# Patient Record
Sex: Female | Born: 1960 | Race: White | Hispanic: No | Marital: Single
Health system: Midwestern US, Academic
[De-identification: ages and names within clinical notes are randomized; demographics above are authoritative.]

---

## 2017-04-05 ENCOUNTER — Encounter: Admit: 2017-04-05 | Discharge: 2017-04-05 | Payer: BC Managed Care – HMO

## 2017-04-05 DIAGNOSIS — K219 Gastro-esophageal reflux disease without esophagitis: ICD-10-CM

## 2017-04-05 DIAGNOSIS — M21372 Foot drop, left foot: Secondary | ICD-10-CM

## 2017-04-05 DIAGNOSIS — G629 Polyneuropathy, unspecified: Secondary | ICD-10-CM

## 2017-04-05 DIAGNOSIS — C541 Malignant neoplasm of endometrium: Principal | ICD-10-CM

## 2017-04-05 DIAGNOSIS — Z79899 Other long term (current) drug therapy: ICD-10-CM

## 2017-04-05 DIAGNOSIS — M549 Dorsalgia, unspecified: Principal | ICD-10-CM

## 2017-04-05 DIAGNOSIS — R300 Dysuria: ICD-10-CM

## 2017-04-05 DIAGNOSIS — Z87891 Personal history of nicotine dependence: ICD-10-CM

## 2017-04-05 LAB — URINALYSIS DIPSTICK
Lab: 1 (ref 1.003–1.035)
Lab: NEGATIVE
Lab: NEGATIVE
Lab: 7.5 (ref 5.0–8.0)
Lab: NEGATIVE

## 2017-04-05 MED ORDER — GABAPENTIN 300 MG PO CAP
300 mg | ORAL_CAPSULE | Freq: Two times a day (BID) | ORAL | 3 refills | Status: AC
Start: 2017-04-05 — End: 2017-06-07

## 2017-04-05 NOTE — Progress Notes
Subjective     GYNECOLOGIC ONCOLOGY EVALUATION    Name:Lisa Shelton    Date: 04/06/17    Referring Physician:     Primary Care Physician: Orson Gear    Chief Complaint:   Chief Complaint   Patient presents with   ??? Heme/Onc Care       History of Present Illness:  Lisa Shelton is a 56 y.o. 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  Presents for 5 month cancer surveillance visit and EOT.       Onc Timeline    Lisa Shelton is a 56 y.o. 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      ??? Referring Physician:  Orson Gear  ??? Contact Name & Number:  [phone] Carmon Ginsberg [phone]  ??? PCP: [name]     Presents for EOT and 5 month cancer surveillance visit. States she continues to have L inguinal/upper thigh and outer thigh pain.  Reports L foot drop and has troubles with weight bearing on L leg.  These symptoms have been present since surgery 11/03/16 and in some situations have worsened.  Did PT for short while following surgery with relief, however symptoms have returned and slightly worsened since discontinuing.  Also reports pelvic cramping on occasion as if she is going to start my period.  Has noticed what appears to be fluid collection at the mons.  She also continues to report fatigue.      Appetite good and hydrating well.  Denies chest pain, SOA, cough, abdominal bloating, hematuria, hematochezia, vaginal bleeding/discharge, constipation/diarrhea, dysuria, bone pain, LE edema or fever.           Endometrial cancer (HCC)    10/11/2016 Initial Diagnosis     [facility] 07/27/16 with report of PMB.  TVUS with thickened endometrium 0.9 cm along with echoic structure along the ventral endometrium measuring 0.5 x 0.6 cm.  EMB with endometrioid carcinoma, FIGO grade 2, nuclear grade 3.          10/16/2016 Imaging     CT C/A/P without lymphadenopathy.  Identified enlarged uterus with prominent endometrial stripe.  Lungs with noted granulomas or scarring only.         11/03/2016 Surgery     RATLH/BSO with bilateral P and PALND per Dr. Kem Boroughs.  Serosal defect to the duodenum with repair by Dr. Kathee Delton.         11/03/2016 Pathology     Uterus with endometrioid adenocarcinoma, FIGO grade 2.  DOI 5/25 mm.  No cervical involvement.  0/41 LN involved.  Tumor size 2.9 cm.  Pelvic washing (-) for malignant cells.    MLH1 (99%); MSH 2 (98%); MSH6 (99%); PMS2 (99%); ER (40%); PR (50%); Ki67 63%; p53 40%.  NO EVIDENCE OF MICROSATELLITE INSTABILITY.                Past Medical History:  Past Medical History:   Diagnosis Date   ??? Back pain    ??? GERD (gastroesophageal reflux disease)        Past Surgical History:  Past Surgical History:   Procedure Laterality Date   ??? HX TUBAL LIGATION  1986   ??? HX CHOLECYSTECTOMY  2014    laprascopic   ??? HYSTERECTOMY, TOTAL ABDOMINAL Bilateral 11/03/2016    ROBOTIC-ASSISTED HYSTERECTOMY, BILATERAL SALPINGO-OOPHORECTOMY, BILATERAL PELVIC AND PERI-AORTIC LYMPH NODE DISSECTION  performed by Irine Seal, MD at [facility]   ???  ENTEROCELE REPAIR  11/03/2016    REPAIR OF ENTEROTOMY performed by Ashcraft, Humberto Leep, DO at [facility]   ??? COLONOSCOPY         Medications:    Current Outpatient Prescriptions:   ???  acetaminophen (TYLENOL) 500 mg tablet, Take 1,000 mg by mouth three times daily. Max of 4,000 mg of acetaminophen in 24 hours. , Disp: , Rfl:   ???  cholecalciferol (VITAMIN D-3) 1,000 units tablet, Take 2,000 Units by mouth daily., Disp: , Rfl:   ???  gabapentin (NEURONTIN) 300 mg capsule, Take one capsule by mouth twice daily., Disp: 60 capsule, Rfl: 3  ???  MV-Min-Vit C-Glut-Lysine-Hb124 (AIRBORNE (LYSINE HCL)) 1,000-50 mg tbef, Take 1 tablet by mouth twice daily., Disp: , Rfl:   ???  omeprazole DR(+) (PRILOSEC) 40 mg capsule, Take 40 mg by mouth daily before breakfast., Disp: , Rfl: 2    Allergies:  No Known Allergies    Social History:  Social History     Social History ??? Marital status: Single     Spouse name: N/A   ??? Number of children: N/A   ??? Years of education: N/A     Occupational History   ??? Not on file.     Social History Main Topics   ??? Smoking status: Former Smoker     Packs/day: 0.25     Years: 12.00     Types: Cigarettes     Quit date: 08/25/2016   ??? Smokeless tobacco: Never Used   ??? Alcohol use 0.6 oz/week     1 Glasses of wine per week      Comment: rarely   ??? Drug use: No   ??? Sexual activity: Not on file     Other Topics Concern   ??? Not on file     Social History Narrative   ??? No narrative on file       Family History:  Family History   Problem Relation Age of Onset   ??? Cancer-Breast Mother    ??? Diabetes Mother    ??? Heart Disease Mother    ??? Hypertension Mother    ??? High Cholesterol Mother    ??? Thyroid Disease Mother    ??? Diabetes Brother    ??? Heart Disease Brother    ??? Hypertension Brother    ??? Thyroid Disease Brother      REVIEW OF SYSTEMS:             CONSTITUTIONAL: per HPI  EYES: per HPI  ENT: per HPI  RESPIRATORY: per HPI  CARDIOVASCULAR: per HPI  GI: per HPI  GU: per HPI  MUSCULO-SKELETAL: per HPI  SKIN: per HPI  ENDOCRINE: per HPI  HEMATOLOGIC: per HPI    Physical Exam:  BP 124/75 (BP Source: Arm, Left Upper)  - Pulse 58  - Temp 36.7 ???C (98 ???F) (Oral)  - Ht 160 cm (63)  - Wt 59.6 kg (131 lb 6.4 oz)  - SpO2 99%  - BMI 23.28 kg/m???   GENERAL APPEARANCE: Appears healthy.  Alert; in no acute distress.  Pleasant.  HEENT: Unremarkable. No tenderness or masses noted.  NECK: Neck supple. No tenderness. No adenopathy.    LUNGS: Chest symmetrical. Good diaphragmatic excursion. Lungs clear; normal breath sounds.  CARDIOVASCULAR:  RRR. Heart sounds normal.      ABDOMEN: Abdomen soft, non-tender.  No masses, organomegaly, or hernia. Minimal fluid collection of mons which appears to be lymphedema.  PELVIC:    Vaginal cuff and  vagina demonstrates atrophy without lesions/masses/nodularity/discharge.  Bimanual and vaginal exams without masses, nodularity or thickening.  Exam chaperoned by nurse  EXTREMITIES: Extremities normal. No joint deformities, edema, or skin discoloration. Station and gait normal.  SKIN: Skin color, texture, turgor normal. No rashes or lesions.  LYMPH NODES: No palpable supraclavicular or inguinal lymph nodes.    ASSESSMENT/PLAN:  ??? BESS VANWAGENEN is a 56 y.o. 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    ??? Presents for 5 month cancer surveillance visit and EOT.   ??? Amb referral to neurology.  Gabapentin 300 mg at HS x 7 days then BID.  Provided with contact information for lymphedema clinic [facility] or [facility].  Encourage pelvic floor therapy as well.  ??? Urine C/S and treat accordingly.  Consider urology consult d/t h/o frequent UTIs.  Consider interstitial cystitis?  ??? Discussed possible symptoms of cancer recurrence, such as cough, chest pain, early satiety, abdominal pain/bloating, N/V, vaginal bleeding/discharge or change in bowel/bladder habits, dizziness, HA.    ??? Recommend daily exercise, diet high in fruits and vegetables, annual visits with PCP including updated vaccination and routine screening (mammogram, DEXA and colonoscopy).    ??? RV 3 months with Dr. Kem Boroughs.     **Time spent with patient 60 min discussing symptoms, reviewing pathology and end of treatment summary.  Counseled on potential symptoms of cancer recurrence and outlined surveillance visit schedule.        Treatment Summary for Endometrial cancer [facility])     Charlton Haws, APRN  04/01/2017  1:22 PM      Cancer Treatment Summary  Provided by Siri Cole, APRN on 04/01/2017       General Information   Patient Name AMYRE BULLMAN   Patient ID [id number]   Phone [phone] (home)    Date of birth 09/23/1960   Age 19 y.o.   Support Contact Extended Emergency Contact Information  Primary Emergency Contact: Butteron,James  Address: [address]           [place] [address] [address]  Home Phone: [phone] Mobile Phone: [phone]  Relation: Significant Other  Secondary Emergency Contact: Deri [place]  Home Phone: [phone]  Mobile Phone: [phone]  Relation: Daughter         Care Team   Patient Care Team:  Orson Gear, MD as PCP - General (Internal Medicine)      Cancer Diagnosis Information   Symptoms/Signs Postmenopausal bleeding with thickened endometrium.  Endometrial biopsy with endometrioid carcinoma.     Diagnosis Endometrial cancer [facility])   Diagnosis Date 11/03/16   Staging Information Cancer Staging  No matching staging information was found for the patient.   Tumor & Prognostic Markers MLH1 (99%); MSH 2 (98%); MSH6 (99%); PMS2 (99%); ER (40%); PR (50%); Ki67 63%; p53 40%.  NO EVIDENCE OF MICROSATELLITE INSTABILITY.    No results found for: BR153, CA2729, HER2NEU   Genomic Testing none   Surgical Procedure: Location/Findings Past Surgical History:   Procedure Laterality Date   ??? HX TUBAL LIGATION  1986   ??? HX CHOLECYSTECTOMY  2014    laprascopic   ??? HYSTERECTOMY, TOTAL ABDOMINAL Bilateral 11/03/2016    ROBOTIC-ASSISTED HYSTERECTOMY, BILATERAL SALPINGO-OOPHORECTOMY, BILATERAL PELVIC AND PERI-AORTIC LYMPH NODE DISSECTION  performed by Irine Seal, MD [facility]   ??? ENTEROCELE REPAIR  11/03/2016    REPAIR OF ENTEROTOMY performed by Ashcraft, Humberto Leep, DO at [facility]   ??? COLONOSCOPY  Tumor Type/Histology/Grade Stage IA endometrioid adenocarcinoma of the uterus, FIGO grade 2.         Background Information   Family History/predisposing conditions Family History   Problem Relation Age of Onset   ??? Cancer-Breast Mother    ??? Diabetes Mother    ??? Heart Disease Mother    ??? Hypertension Mother    ??? High Cholesterol Mother    ??? Thyroid Disease Mother    ??? Diabetes Brother    ??? Heart Disease Brother    ??? Hypertension Brother    ??? Thyroid Disease Brother       Warden/ranger none   Social History Social History   Substance Use Topics   ??? Smoking status: Former Smoker Packs/day: 0.25     Years: 12.00     Types: Cigarettes     Quit date: 08/25/2016   ??? Smokeless tobacco: Never Used   ??? Alcohol use 0.6 oz/week     1 Glasses of wine per week      Comment: rarely            Treatment Summary   Radiation Therapy:  N/A      [No treatment plan]       Pre-Treatment Post-Treatment   Height  Ht Readings from Last 1 Encounters:   01/04/17 157.5 cm (62)      Weight  Wt Readings from Last 1 Encounters:   01/04/17 58.8 kg (129 lb 9.6 oz)      BSA  Estimated body surface area is 1.6 meters squared as calculated from the following:    Height as of 01/04/17: 157.5 cm (62).    Weight as of 01/04/17: 58.8 kg (129 lb 9.6 oz).      Lifetime Dosage   Lifetime Dose Tracking:   No doses have been documented on this patient for the following tracked chemicals: mitomycin, epirubicin, doxorubicin, idarubicin, bleomycin, daunorubicin, mitoxantrone, vincristine, doxorubicin HCl pegylated liposomal, daunorubicin citrate liposomal            Follow-Up & Survivorship Care   Future Appointments  Date Time Provider Department Center   04/05/2017 2:00 PM Charlton Haws, APRN [facility] [place] Exam        GYNECOLOGIC CANCER SURVIVORSHIP:  Radiation and other cancer therapies such as surgery and chemotherapy can increase the risk that you may develop additional health problems in the months and years after treatment.  During this time, it is important that you continue to work closely with your healthcare providers to maintain your overall health, to monitor for any treatment related side effects which may develop, and to watch for signs of the cancer returning.  The information provided below is meant to provide additional resources and information which you may find helpful in this process.  Please keep in mind, this is general information which may or may not apply to your specific situation; it should not be taken as a substitute for the advice of your healthcare providers.   ???  SELF CARE AND HEALTH MAINTENANCE:    ??? Weight and Nutrition:  Maintain a healthy weight for your age and height.  Weigh yourself 1-2 times per week to track your progress.  Exercise upon recovery and healing when tolerated.  If you are interested in meeting with a hospital dietician, please contact our office to schedule an appointment.  ???  Emotional distress:  Post treatment can be a difficult time. Fear, anxiety, or sadness may actually increase. In addition to the relief of  ending treatment, many people report feeling more insecure without the safety net of active treatment and frequent contact with medical staff. Even with a good prognosis, fear of cancer recurrence is very common for many people. Knowing this is very common can normalize this issue somewhat. Developing a personal health/wellness plan that addresses your emotional, physical, social, and spiritual well-being can be of great importance. We have psychologists that specifically help with cancer patients in this emotional time. Please contact our office if this is of interest. Support groups can also be helpful, see information below on [facility] support group specific to gynecologic cancer patients.  ???  Smoking Cessation:  If you are a current smoker, smoking cessation is key to your overall health and recovery.  If you are interested in smoking cessation, there are a number of treatment plans and resources available to help, including medications and/or behavior therapy (counseling).  Please let us know if we can assist you in this process.  ???  COMMON TREATMENT RELATED SIDE EFFECTS:  ???  Sexual Intimacy Issues:    Vaginal dryness and scarring following your treatment can result in discomfort or pain during intercourse.  Use of a lubricant and vaginal dilator can help with these symptoms.    ???  Menopausal Symptoms:  Surgery and/or radiation can result in early menopausal in women who are premenopausal prior to treatment.  Symptoms may include hot flashes, night sweats, and vaginal dryness.    ???  Leg Swelling:  It is possible that swelling of the legs, a condition called lymphedema, may occur following your treatment.  Should this develop, please contact your physician.  Treatments your physician may recommended include compression hose, therapeutic massage, or physical therapy.  ???  FOLLOW UP of GYNECOLOGIC CANCER:  Once you have been diagnosed with cancer, it is possible for the cancer to return or to spread to other parts of the body.  It is important to continue regular follow up visits with your radiation oncologist and gynecologist to monitor for any signs of cancer recurrence and for assistance with any side effects of treatment.  These visits will typically include a history and physical exam, including a pelvic exam.   Your physicians may also recommend imaging studies such as x-rays or CT scans.  The frequency of follow up visits will depend on the type and stage of your cancer as well as other risk factors and are usually performed at 3-6 month intervals for the first few years after completion of treatment.  ???  WHEN TO CALL  Please call our office if you experience any of the following symptoms after receiving radiation:  ??? Increased vaginal bleeding or discharge  ??? Unexplained weight loss  ??? New leg swelling  ??? Blood in your stool or urine   ??? New lumps or bumps (such as in the groin or neck)  ??? Fevers greater than 100 deg. F  ??? Difficulty with urination or bowel movements  ??? New pelvic or abdominal pain  ???  ADDITIONAL RESOURCES  [place]   [address], [address]   [place], [place] [address]     Third Tuesdays   Social time 6-6:30 p.m.   Meeting 6:30-8 p.m.   Contact [place] [phone] or Pieter Partridge [phone]  ???  Society for Gynecologic Oncology  [email or web address]  ???  Centers for Disease Control and Prevention ResearchName.uy  ???  American Institute for Cancer Research  http://www.[place].info/.html  Siri Cole, APRN

## 2017-04-06 LAB — CULTURE-URINE W/SENSITIVITY

## 2017-06-07 ENCOUNTER — Ambulatory Visit: Admit: 2017-06-07 | Discharge: 2017-06-08 | Payer: BC Managed Care – HMO

## 2017-06-07 ENCOUNTER — Encounter: Admit: 2017-06-07 | Discharge: 2017-06-07 | Payer: BC Managed Care – HMO

## 2017-06-07 DIAGNOSIS — M549 Dorsalgia, unspecified: Principal | ICD-10-CM

## 2017-06-07 DIAGNOSIS — K219 Gastro-esophageal reflux disease without esophagitis: ICD-10-CM

## 2017-06-07 NOTE — Progress Notes
Date of Service: 06/07/2017    Subjective:             Lisa Shelton is a 56 y.o. female.    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    Up until March 2018 the patient has been doing well and had no prior history of neurologic problems whatsoever.  Following a diagnosis of the pelvic lesion she had A RATLH/BSO operation.  Pathology of the lesion showed endometrioid adenocarcinoma of the uterus,FIGO grade 2.  No chemotherapy or radiation therapy was required this follow-up treatment and extensive lymph node dissection (#40) was negative.    Upon awakening from surgery the patient quickly noticed that she had difficulty scooting the left leg laterally when trying to transfer from one stretcher to the hospital bed.  She also quickly realized that she had a fist-sized area of numbness around the left inguinal ligament.  She also noticed that she could not swing the left leg out of the bed and had profound weakness with hip flexion as she describes that she could not pick up the left thigh.  Walking was preserved although the patient could not climb steps with a left lower extremity.    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    Notably the patient since discharge from the hospital has continued to stably but slowly improve.  She did physical therapy for 6 weeks.  She has had no bowel or bladder changes and function.  There has been no severe or progressive pain in the pelvic area and particularly in the left pelvis.    The patient presents today to get additional input on management of the left lower extremity proximal weakness as well as additional input on prognosis.      WORKUP TO DATE CT abdomen and pelvis with contrast 11/04/2016:    Interval hysterectomy with no retroperitoneal hematoma or hemoperitoneum.  A small amount of complex pelvic ascites was seen.  Extensive subcutaneous emphysema throughout the chest, abdomen and pelvis with pneumomediastinum, pneumoperitoneum and right retroperitoneal free air which is likely postoperative.    MRI lumbar spine June 02, 2017:   The patient brought in a disc of the study that was done 1 week ago.  I reviewed the images on the disc and saw evidence of L4-L5 and L5-S1 disc disease without significant spinal stenosis and specifically no findings that correlate well with the patient's current presentation.       Review of Systems   Constitutional: Positive for activity change and fatigue.   Musculoskeletal: Positive for back pain.   Neurological: Positive for numbness.   All other systems reviewed and are negative.        Objective:         ??? acetaminophen (TYLENOL) 500 mg tablet Take 1,000 mg by mouth three times daily. Max of 4,000 mg of acetaminophen in 24 hours.    ??? cholecalciferol (VITAMIN D-3) 1,000 units tablet Take 2,000 Units by mouth daily.   ??? MV-Min-Vit C-Glut-Lysine-Hb124 (AIRBORNE (LYSINE HCL)) 1,000-50 mg tbef Take 1 tablet by mouth twice daily.   ??? Omeprazole (PRILOSEC) 20 mg tablet Take 40 mg by mouth daily before breakfast.     Vitals:  06/07/17 1319   BP: 109/66   Pulse: 75   Weight: 61.2 kg (135 lb)   Height: 160 cm (63)     Body mass index is 23.91 kg/m???.     Physical Exam    GENERAL EXAM:    GEN: Comfortable appearing.  In good spirits  EXTREMITIES: No cyanosis, clubbing or edema   SKIN: No rashes or lesions       NEUROLOGIC EXAMINATION    Mental status: Awake, alert, attentive. Normal language.    Cranial Nerves:   II:   Visual fields are full.   Pupils equal, round and reactive to light  Funduscopic exam:   III, IV, VI: ocular movements intact  CN V: normal face sensation  CN VII: no upper or lower facial weakness Orbicularis oculi: intact  CN VIII: hearing intact to conversation  CN IX, X: palate elevates symmetrically. Not hoarse   CN XI: shoulder shrug intact bilaterally  CN XII: tongue midline, no atrophy    Motor:  Bulk: intact  Tone: normal  Abnormal movements: no tremor, no fasciculations   Clinical myotonia:     Muscles     Neck flexors  5    Neck extensors  5      Upper Extremity Muscle  Right  Left  Lower Extremity Muscle  Right  Left    Shoulder abductors  5  5  Hip flexors  5  4   Elbow flexors  5  5  Hip extensors      Elbow extensors  5  5  Hip abductors  5 5   Wrist flexors    Hip adductors  5 4   Wrist extensors  5  5  Knee flexors         Knee extensors  5  5    Finger abductors 5  5  Ankle plantar flexors      T 5  5    Finger extensors   Ankle dorsiflexors          P 5  4+    Distal finger flex (dig 2,3)   Ankle inversion               T     Distal finger flex (dig 4,5)   Ankle eversion                P     Thumb abduction (APB)   Toe flexors                      T     Thumb flexion (FPL)   EHL(great toe extensor)P  5 4++       Sensory:   Pinprick: decreased in a palm side area just distal to the inguinal ligament.   Temperature: as above  Light touch:   Vibration: (seconds at the great toe)  Right: 12  Left 12  Proprioception (great toes): intact      Coordination: finger to nose testing normal    Reflexes:   Right  Left    Triceps  3+ throughout    Biceps      Brachioradialis      Patella      Ankle      Plantar Responses downgoing  downgoing    Jaw jerk:    Hoffman sign:     Station and Gait:   -walks with ease, narrow based and symmetric.   -difficulty walking on the the left  heal, otherwise no problems with toe waking    Standing up from a chair with the arms crossed over the chest: Is able to do this albeit a bit slowly.       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  Of the hysterectomy performed in March 2018.  The involvement of muscles innervated by the femoral nerve (hip flexion), obturator nerve (thigh abduction) and sciatic nerve (plantar flexion and dorsiflexion albeit mildly) suggest a lumbosacral plexus lesion in the setting over a mononeuropathy.  The patient's consistent albeit gradual improvement in the last 7 months is very reassuring and makes an underlying process like new malignant disease much less likely.  Something like this would also tend to be painful.  Radiation related lumbosacral plexopathy is not a consideration here as the patient was not treated with radiation therapy.    Plan:  ???I asked the patient to call us on short notice if she notices any worsening of her motor deficits.  This would be a very concerning symptom.  ???Otherwise advised on prognosis which is hard to know for sure.  If she is continuing to improve in a week to week basis then it is possible that she may continue to regain strength although I do not know where she might reach a ceiling.  If she has stopped improving currently then this would indicate a more guarded prognosis for continued improvement.  -Otherwise recommend continued supportive care.  Follow-up can be on an as-needed basis.

## 2017-06-08 ENCOUNTER — Encounter: Admit: 2017-06-08 | Discharge: 2017-06-08 | Payer: BC Managed Care – HMO

## 2017-06-08 DIAGNOSIS — M21372 Foot drop, left foot: ICD-10-CM

## 2017-06-08 DIAGNOSIS — S344XXA Injury of lumbosacral plexus, initial encounter: Principal | ICD-10-CM

## 2017-06-09 ENCOUNTER — Encounter: Admit: 2017-06-09 | Discharge: 2017-06-09 | Payer: BC Managed Care – HMO

## 2017-06-28 ENCOUNTER — Encounter: Admit: 2017-06-28 | Discharge: 2017-06-28 | Payer: BC Managed Care – HMO

## 2017-06-28 DIAGNOSIS — M549 Dorsalgia, unspecified: Principal | ICD-10-CM

## 2017-06-28 DIAGNOSIS — C541 Malignant neoplasm of endometrium: Principal | ICD-10-CM

## 2017-06-28 DIAGNOSIS — Z803 Family history of malignant neoplasm of breast: ICD-10-CM

## 2017-06-28 DIAGNOSIS — K219 Gastro-esophageal reflux disease without esophagitis: ICD-10-CM

## 2017-06-28 DIAGNOSIS — Z807 Family history of other malignant neoplasms of lymphoid, hematopoietic and related tissues: ICD-10-CM

## 2017-06-28 DIAGNOSIS — R9389 Abnormal findings on diagnostic imaging of other specified body structures: ICD-10-CM

## 2017-06-28 NOTE — Progress Notes
Name: Lisa Shelton          MRN: [id number]      DOB: 1960-10-17      AGE: 56 y.o.   DATE OF SERVICE: 06/28/2017    Subjective:             Reason for Visit: Routine Surveillance.    Heme/Onc Care      Lisa Shelton is a 56 y.o. female.     Cancer Staging  No matching staging information was found for the patient.    History of Present Illness    Patient Active Problem List    Diagnosis Date Noted   ??? 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    OZH:[id number] [name], [name], MD    HPI:    Ms Lisa Shelton is a  56 yo G2 P2  who had a well woman exam and Pap smear on December 11th 2017.  At that time she reported postmenopausal bleeding and cramping since she began hormone replacement therapy in February 2017.  Plan was to follow-up in February.    On September 16, 2016 she had an ultrasound that showed the uterus to be anteverted measuring 9.2 x 3.8 x 5 cm with a 9 mm endometrial thickening as well as small heterogeneously echogenic structure along the ventral endometrium measuring 5 x 6  millimeters.  There is focal blood flow along this lesion.    On September 29, 2016 showed a follow-up with her primary care physician for an endometrial biopsy which showed an endometrioid adenocarcinoma favor endometrioid FIGO grade 2.  There were however focal areas with higher grade cytology nuclear grade 3 which raises the possibility of a high-grade tumor.  Immunostains attempted for clarification but demonstrated scattered and variable positivity for P 16, p53 and Ki-67.  While endometrioid adenocarcinoma is favored other high-grade subtype such as high-grade serous cannot be completely excluded by biopsy alone.    She is now being referred to my office.  Her last office note with Dr. [name] on 09/29/2016.  The patient is wondering about postmenopausal bleeding and getting back on her polyps.  She did an endometrial biopsy polyp was visualized, cervical versus endometrial.  This was noted on ultrasound.  Recommend a referral to Dr. Broadus John for hysteroscopy [facility] and ablation pending results.  The uterus sounded to 9 cm.    She was seen by Dr. [name] on 07/27/2016 for well woman with Pap.  At that time she was noted to have some vaginal bleeding since starting hormone replacement therapy, she is following up in February with her gynecologist/HRT physician. To call if persistent bleeding for pelvic US. Also appears to have added progesterone  200 mg/day      TREATMENT:    1.  11/03/2016:  Robotic assisted total laparoscopic hysterectomy, bilateral salpingo-oophorectomy, bilateral pelvic and periaortic lymph node dissection.    Final Pathology:  Endometrial adenocarcinoma endometrioid type, FIGO grade???2,  invading 5mm out of 25mm myometrium.  Maximal tumor size: 2.9 cm. All P and PALND negative. Final pathology did not confirm p53 positivity.    2.  11/12/2016: Presented at Cancer Conference. No recommendations for treatment. Observation only.    3. 11/19/2016: Presents for first postoperative visit. The patient is aware of the pathology and recommendations.    4.  06/28/2017:  Last seen on 04/05/2017 for  EOT visit.  She is now 7.5 month from surgery.  Understands screening for endometrial cancer including: q 6 month visits, pelvic evaluation, no pap smears and no CT Scans unless symptomatic.          OLDER RECORDS:    Pathology:  1.  MAWD: Specimen number [id number] 40.  Endometrial biopsy: Endometrial adenocarcinoma favor endometrioid FIGO grade 2.  There are focal areas of high-grade cytology/nuclear grade 3 which raises the possibility of a high-grade tumor.  Immunostains were done but showed scattered and variable positivity for P 16, p53, and Ki-67.  Other high-grade subtype such as high-grade serous cannot be excluded.  2.  Pap smear collected 07/27/2016 Lisa Shelton, atypical endometrial glandular cells of undetermined significance are present.    Radiology/procedures:    1. CT:  no  2. Ultrasound: [facility] 09/16/2016 performed for dysfunctional uterine bleeding in a 56 year old female.  The uterus is anteverted measuring 9.2 x 3.8 x 5 cm.  The endometrium measures up to 9 mm, there is small heterogeneously echoic structure along the ventral endometrium measuring 5 x 6 mm.  There is focal blood flow near the interface of the endometrium and the myometrium along this lesion.  No free fluid.  Both ovaries appeared normal.  3. Other: no        PMH:    1. Any history of problems with heart/lung/kidney/liver/hepatitis/DM/thyroid disease/hematologic disorders/DVT: Yes  2.  BMI, 132 pounds  3.  Smoker 3-4 cigarettes per week  4.  Chronic headaches  5.  GERD  6.  DU P???teens, depression        PSH:    1.  Tubal 1986  2.  Cholecystectomy in 2014          OB/GYN:  1. G2 P2  2. Menarche: 14  3. LMP/menopausal: Late 2013  4. OCP/ERT/HRT: Yes for under 1 year; hormone replacement therapy started February 2017 progesterone 100 mg recently quit, used pellets?  For 1 month then DC'd  5. Birth control-current: Not applicable  6. STDs: no  7. Sexually active: yes  8. Fibroids/endometriosis: unknown  9. If premenopausal, menstrual pattern, normal: Not applicable  10. Last pap smear: 10/27/2012 normal      SH:  1. Smoke: 3-4 cig/week. 1 pack year hx.  2. Drugs: no  3. ETOH: social  4. 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  5. Marital status: divorced; lives with boyfriend  6. Last colonoscopy: 2014  7. Last Mammogram: 06/2016      FH:  1. Cancer: m-breast; sister-lymphoma. No hx of colon, pancreatic, ovarian, uterine                              Review of Systems   Constitutional: Negative for appetite change, fatigue, fever and unexpected weight change.   HENT: Negative for congestion, mouth sores, tinnitus and voice change.    Eyes: Negative for visual disturbance. Respiratory: Negative for cough and shortness of breath.    Cardiovascular: Negative for chest pain, palpitations and leg swelling.   Gastrointestinal: Negative for abdominal distention, abdominal pain, constipation, diarrhea, nausea and vomiting.   Genitourinary: Positive for frequency. Negative for dysuria, hematuria, pelvic pain, urgency, vaginal bleeding, vaginal discharge and vaginal pain.   Musculoskeletal: Negative for arthralgias, back pain and myalgias.   Skin: Negative for color change and rash.   Neurological: Negative for dizziness, seizures, numbness and headaches.   Psychiatric/Behavioral: Negative for dysphoric mood. The  patient is not nervous/anxious.          Objective:         ??? acetaminophen (TYLENOL) 500 mg tablet Take 1,000 mg by mouth three times daily. Max of 4,000 mg of acetaminophen in 24 hours.    ??? cholecalciferol (VITAMIN D-3) 1,000 units tablet Take 2,000 Units by mouth daily.   ??? MV-Min-Vit C-Glut-Lysine-Hb124 (AIRBORNE (LYSINE HCL)) 1,000-50 mg tbef Take 1 tablet by mouth twice daily.   ??? Omeprazole (PRILOSEC) 20 mg tablet Take 20 mg by mouth daily before breakfast.     Vitals:    06/28/17 1045   BP: 109/72   Pulse: 72   Resp: 18   Temp: 36.8 ???C (98.3 ???F)   TempSrc: Oral   SpO2: 99%   Weight: 62.2 kg (137 lb 3.2 oz)   Height: 160 cm (63)     Body mass index is 24.3 kg/m???.               Pain Addressed:  N/A    Patient Evaluated for a Clinical Trial: Patient not eligible for a treatment trial (including not needing treatment, needs palliative care, in remission).     [place] Co[facility] Oncology Group performance status is 0, Fully active, able to carry on all pre-disease performance without restriction.Marland Kitchen     Physical Exam   Constitutional: She is oriented to person, place, and time. She appears well-developed and well-nourished.   HENT:   Head: Normocephalic.   Eyes: Pupils are equal, round, and reactive to light. Neck: Normal range of motion. Neck supple. No tracheal deviation present. No thyromegaly present.   Cardiovascular: Normal rate, regular rhythm, normal heart sounds and intact distal pulses.    Pulmonary/Chest: Effort normal and breath sounds normal. No respiratory distress. She has no wheezes. She has no rales. She exhibits no tenderness.   Abdominal: Soft. Bowel sounds are normal. She exhibits no distension and no mass. There is no tenderness. There is no rebound and no guarding.   Genitourinary:   Genitourinary Comments: Bimanual/Rectovaginal examination:    Uterus: no  Cervix: no  No palpable masses/irregularity/nodularity.    ++Stool    Vulva: no lesions  Vagina: no lesions  Anus: no lesions  Urethra: placement is normal without lesions.  No atrophy, no narrowing of the vagina    Pelvic examination was chaperoned by nurse.     Musculoskeletal: Normal range of motion. She exhibits no edema or tenderness.   Lymphadenopathy:     She has no cervical adenopathy.        Right: No inguinal and no supraclavicular adenopathy present.        Left: No inguinal and no supraclavicular adenopathy present.   Neurological: She is alert and oriented to person, place, and time. She displays normal reflexes. Coordination normal.   Skin: Skin is warm and dry.   Psychiatric: She has a normal mood and affect. Her behavior is normal.             Assessment and Plan:  Patient Active Problem List    Diagnosis Date Noted   ??? 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 Endometrial cancer (HCC) 10/11/2016     PLAN:  Clinically doing well.  No issues or concerns.    Reviewed diet/exercise/symptoms of recurrence.    RV in 3 months.    Amil Amen A. Sibyl Parr, MD  Associate Professor  Gynecology Oncology

## 2017-10-07 ENCOUNTER — Encounter: Admit: 2017-10-07 | Discharge: 2017-10-07 | Payer: BC Managed Care – HMO

## 2017-10-07 DIAGNOSIS — Z803 Family history of malignant neoplasm of breast: ICD-10-CM

## 2017-10-07 DIAGNOSIS — Z79899 Other long term (current) drug therapy: ICD-10-CM

## 2017-10-07 DIAGNOSIS — K219 Gastro-esophageal reflux disease without esophagitis: ICD-10-CM

## 2017-10-07 DIAGNOSIS — N852 Hypertrophy of uterus: ICD-10-CM

## 2017-10-07 DIAGNOSIS — M549 Dorsalgia, unspecified: Principal | ICD-10-CM

## 2017-10-07 DIAGNOSIS — Z87891 Personal history of nicotine dependence: ICD-10-CM

## 2017-10-07 DIAGNOSIS — C541 Malignant neoplasm of endometrium: Principal | ICD-10-CM

## 2017-11-18 ENCOUNTER — Encounter: Admit: 2017-11-18 | Discharge: 2017-11-18 | Payer: BC Managed Care – HMO

## 2017-11-18 DIAGNOSIS — M549 Dorsalgia, unspecified: Principal | ICD-10-CM

## 2017-11-18 DIAGNOSIS — K219 Gastro-esophageal reflux disease without esophagitis: ICD-10-CM

## 2018-01-06 ENCOUNTER — Encounter: Admit: 2018-01-06 | Discharge: 2018-01-06 | Payer: BC Managed Care – HMO

## 2018-01-06 DIAGNOSIS — M549 Dorsalgia, unspecified: Principal | ICD-10-CM

## 2018-01-06 DIAGNOSIS — K219 Gastro-esophageal reflux disease without esophagitis: ICD-10-CM

## 2018-01-06 DIAGNOSIS — C541 Malignant neoplasm of endometrium: Principal | ICD-10-CM

## 2018-07-07 ENCOUNTER — Encounter: Admit: 2018-07-07 | Discharge: 2018-07-07 | Payer: BC Managed Care – HMO

## 2018-07-07 DIAGNOSIS — C541 Malignant neoplasm of endometrium: ICD-10-CM

## 2018-07-07 DIAGNOSIS — K219 Gastro-esophageal reflux disease without esophagitis: ICD-10-CM

## 2018-07-07 DIAGNOSIS — M549 Dorsalgia, unspecified: Principal | ICD-10-CM

## 2018-07-07 DIAGNOSIS — Z8542 Personal history of malignant neoplasm of other parts of uterus: ICD-10-CM

## 2018-07-07 DIAGNOSIS — Z08 Encounter for follow-up examination after completed treatment for malignant neoplasm: Principal | ICD-10-CM

## 2018-12-02 ENCOUNTER — Encounter: Admit: 2018-12-02 | Discharge: 2018-12-02 | Payer: BC Managed Care – HMO

## 2019-01-05 ENCOUNTER — Encounter: Admit: 2019-01-05 | Discharge: 2019-01-05 | Payer: BC Managed Care – HMO

## 2019-01-05 DIAGNOSIS — K219 Gastro-esophageal reflux disease without esophagitis: ICD-10-CM

## 2019-01-05 DIAGNOSIS — C541 Malignant neoplasm of endometrium: Principal | ICD-10-CM

## 2019-01-05 DIAGNOSIS — M549 Dorsalgia, unspecified: Principal | ICD-10-CM

## 2019-01-05 NOTE — Progress Notes
NA 141 10/26/2016 12:35 PM    K 4.0 10/26/2016 12:35 PM    CL 105 10/26/2016 12:35 PM    CO2 27 10/26/2016 12:35 PM    GAP 9 10/26/2016 12:35 PM    BUN 11 10/26/2016 12:35 PM    CR 0.68 10/26/2016 12:35 PM    GLU 100 10/26/2016 12:35 PM    Lab Results   Component Value Date/Time    CA 9.5 10/26/2016 12:35 PM    GFR >60 10/26/2016 12:35 PM    GFRAA >60 10/26/2016 12:35 PM          Other labs    No results found for: ESR No results found for: LDH       ASSESSMENT/PLAN:Lisa Shelton is a 58 y.o. 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  On surveillance.     No evidence of disease on Review of systems  Monitor for signs of recurrence, abdominal bloating, pain, pelvic pain, nausea and vomiting, bleeding from vagina,  blood in the stool or urine, irregular bowel movement.  Discussed healthy life style, whole grain foods and whole foods and five servings of fruits and vegetables.  Exercise-Intentional exercise,  54mt/day for  5 days a wk.  Update on immunization  Screening for breast cancer with MMG, colon cancer with colonoscopy, osteoporosis with bone density.  Regular f/u with PCP  RV with Dr. Sibyl Parr in 6 months    Time spent about 5 mts    Thank you for allowing me to participate in the care of Lisa Shelton   Leta Speller, MS,PA-C  Supervising physician: Kathrin Penner  Gynecology Oncology  [facility], [facility] [place], [place] [address]  Nurse line: [phone]

## 2019-01-05 NOTE — Progress Notes
Lisa Shelton gave verbal report of self obtained vital signs prior to Telehealth appointment. Medications, allergies, history and screening questions reviewed with patient. Patient confirms she received the financial policy, consent to treat and notice of privacy policies and verbally agrees to the policies they contain.

## 2019-01-11 ENCOUNTER — Encounter: Admit: 2019-01-11 | Discharge: 2019-01-11 | Payer: BC Managed Care – HMO

## 2019-01-11 DIAGNOSIS — M549 Dorsalgia, unspecified: Principal | ICD-10-CM

## 2019-01-11 DIAGNOSIS — K219 Gastro-esophageal reflux disease without esophagitis: ICD-10-CM

## 2019-06-26 ENCOUNTER — Encounter: Admit: 2019-06-26 | Discharge: 2019-06-26 | Payer: BC Managed Care – HMO

## 2019-06-26 NOTE — Telephone Encounter
Called patient and left a message that Dr. Freda Munro is out of clinic 11/12. Rescheduled patient for January and left the scheduling line to call and confirm. A MyChart message was sent advising as well.

## 2019-08-25 ENCOUNTER — Encounter: Admit: 2019-08-25 | Discharge: 2019-08-25 | Payer: BC Managed Care – HMO

## 2019-09-07 ENCOUNTER — Encounter: Admit: 2019-09-07 | Discharge: 2019-09-07 | Payer: BC Managed Care – HMO

## 2019-09-07 DIAGNOSIS — C541 Malignant neoplasm of endometrium: Secondary | ICD-10-CM

## 2019-09-07 NOTE — Progress Notes
Subjective      GYNECOLOGIC ONCOLOGY EVALUATION    Date: 09/07/19    Name:Lisa Shelton is a 59 y.o. female     Cancer Staging  No matching staging information was found for the patient.    Referring Physician:     Primary Care Physician: Nicole Cella    Chief Complaint:   Chief Complaint   Patient presents with   ? Follow Up       History of Present Illness: Mr. Hoffmann is a 59YO female with history of stage 1A endometrioid carcinoma of the uterus presenting today for surveillance. She denies any changes in her health history over the past year. Reports feeling well and just saw her PCP last week for a routine visit and to complete the shingles vaccine series. She also received the flu vaccine at this time. She had a mammogram back in October 2020 and is up to date on her colonoscopy. She denies any fever, chills, SOB, palpitations, chest pain, abdominal pain, change in bowel or bladder habits, vaginal bleeding or discharge, swelling or pain of extremities, general malaise.      Onc Timeline Overview Note   Lisa Shelton is a 59 y.o. female , pt of Dr. 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  On surveillance.     ? Referring Physician:  Orson Gear  ? Contact Name & Number:  [phone] F [phone]  ? PCP: [name]     Pt has no complains . No fever, chills, fatigue, no nose bleeds, no sore throat, no cough or SOB, no chest pain, no edema, no abdominal pain or distention, no nausea no vomiting, regular BM, no urinary symptoms, no vaginal bleeding, discharge or pain, no pelvic pain, no skin rashes.  MMG in 05/2018, WNL  Colonoscopy about 6 yr ago     Endometrial cancer (HCC)   10/11/2016 Initial Diagnosis    WWE 07/27/16 with report of PMB.  TVUS with thickened endometrium 0.9 cm along with echoic structure along the ventral endometrium measuring 0.5 x 0.6 cm.  EMB with endometrioid carcinoma, FIGO grade 2, nuclear grade 3.      10/16/2016 Imaging CT C/A/P without lymphadenopathy.  Identified enlarged uterus with prominent endometrial stripe.  Lungs with noted granulomas or scarring only.     11/03/2016 Surgery    RATLH/BSO with bilateral P and PALND per Dr. Kem Boroughs.  Serosal defect to the duodenum with repair by Dr. Kathee Delton.     11/03/2016 Pathology    Uterus with endometrioid adenocarcinoma, FIGO grade 2.  DOI 5/25 mm.  No cervical involvement.  0/41 LN involved.  Tumor size 2.9 cm.  Pelvic washing (-) for malignant cells.    MLH1 (99%); MSH 2 (98%); MSH6 (99%); PMS2 (99%); ER (40%); PR (50%); Ki67 63%; p53 40%.  NO EVIDENCE OF MICROSATELLITE INSTABILITY.               Past Medical History:  Medical History:   Diagnosis Date   ? Back pain    ? GERD (gastroesophageal reflux disease)        Past Surgical History:  Surgical History:   Procedure Laterality Date   ? HX TUBAL LIGATION  1986   ? HX CHOLECYSTECTOMY  2014    laprascopic   ? ROBOTIC-ASSISTED HYSTERECTOMY, BILATERAL SALPINGO-OOPHORECTOMY, BILATERAL PELVIC AND PERI-AORTIC LYMPH NODE DISSECTION  Bilateral 11/03/2016    Performed by Irine Seal[facility] MD at [facility] OR   ?  REPAIR OF ENTEROTOMY  11/03/2016    Performed by Costella Hatcher, DO at [facility] OR   ? COLONOSCOPY         Medications:    Current Outpatient Medications:   ?  acetaminophen (TYLENOL) 500 mg tablet, Take 1,000 mg by mouth three times daily. Max of 4,000 mg of acetaminophen in 24 hours. , Disp: , Rfl:   ?  cholecalciferol (VITAMIN D-3) 1,000 units tablet, Take 2,000 Units by mouth daily., Disp: , Rfl:   ?  MV-Min-Vit C-Glut-Lysine-Hb124 (AIRBORNE (LYSINE HCL)) 1,000-50 mg tbef, Take 1 tablet by mouth twice daily., Disp: , Rfl:   ?  nitrofurantoin monohyd/m-cryst (MACROBID) 100 mg capsule, Take 100 mg by mouth every 12 hours. Take with food., Disp: , Rfl:   ?  omeprazole DR (PRILOSEC) 40 mg capsule, Take 40 mg by mouth daily before breakfast., Disp: , Rfl: 2    Allergies:  No Known Allergies    Social History:  Social History     Socioeconomic History ? Marital status: Single     Spouse name: Not on file   ? Number of children: Not on file   ? Years of education: Not on file   ? Highest education level: Not on file   Occupational History   ? Not on file   Tobacco Use   ? Smoking status: Former Smoker     Packs/day: 0.25     Years: 12.00     Pack years: 3.00     Types: Cigarettes     Quit date: 08/25/2016     Years since quitting: 3.0   ? Smokeless tobacco: Never Used   Substance and Sexual Activity   ? Alcohol use: Yes     Alcohol/week: 1.0 standard drinks     Types: 1 Glasses of wine per week     Comment: rarely   ? Drug use: No   ? Sexual activity: Not on file   Other Topics Concern   ? Not on file   Social History Narrative   ? Not on file       Family History:  Family History   Problem Relation Age of Onset   ? Cancer-Breast Mother    ? Diabetes Mother    ? Heart Disease Mother    ? Hypertension Mother    ? High Cholesterol Mother    ? Thyroid Disease Mother    ? Diabetes Brother    ? Heart Disease Brother    ? Hypertension Brother    ? Thyroid Disease Brother          REVIEW OF SYSTEMS:     CONSTITUTIONAL: Negative unless stated in HPI  EYES: Negative unless stated in HPI  ENT: Negative unless stated in HPI  RESPIRATORY: Negative unless stated in HPI  CARDIOVASCULAR: Negative unless stated in HPI  GI: Negative unless stated in HPI  GU: Negative unless stated in HPI  MUSCULO-SKELETAL: Negative unless stated in HPI  SKIN: Negative unless stated in HPI    ECOG 0      Physical Exam  BP 120/71 (BP Source: Arm, Left Upper)  - Pulse 80  - Temp 36.7 ?C (98 ?F) (Oral)  - Resp 16  - Ht 160 cm (63)  - Wt 64 kg (141 lb)  - SpO2 99%  - BMI 24.98 kg/m?   GENERAL APPEARANCE: Appears healthy.  Alert; in no acute distress.  Pleasant.  HEENT: Unremarkable. No tenderness or masses noted.  NECK: Neck supple.  No tenderness. No adenopathy.    LUNGS: Chest symmetrical. Good diaphragmatic excursion. Lungs clear; normal breath sounds.  CARDIOVASCULAR:  RRR. Heart sounds normal. ABDOMEN: Abdomen soft, non-tender.  No masses, organomegaly, or hernia. No clinical evidence of ascites.    PELVIC:   External genitalia, anus, perineum, urethral meatus, urethra, bladder and vagina normal. Vaginal cuff and vagina without lesions/masses/nodularity/discharge. Bimanual and rectovaginal exam without masses or nodularity on the pelvic floor.  Exam chaperoned by nurse  EXTREMITIES: Extremities normal. No joint deformities, edema, or skin discoloration. Station and gait normal.  SKIN: Skin color, texture, turgor normal. No rashes or lesions.  LYMPH NODES: No palpable supraclavicular or inguinal lymph nodes.        ASSESSMENT/PLAN:Tasheka B Morsey is a 59 y.o. 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  On surveillance.     No evidence of disease per clinical exam and review of systems.     Patient up to date on all health maintenance.     Reviewed signs to watch for that could indicate a local or distant recurrence. Patient will call our office with any new signs.   Local recurrence  Signs and symptoms of local recurrence after endometrial cancer may include:   ? bleeding   ? pain  Regional recurrence   A regional  cancer recurrence means the cancer has come back in bleeding.   Signs and symptoms of regional recurrence may include:   ? pain   ? Leg/hip swelling  ? bleeding   ? Gi/gu problems  Distant (metastatic) recurrence   A distant, or metastatic, recurrence means the cancer has traveled to distant parts of the body, such as the bones, liver and lungs. Less common to the brain.  The signs and symptoms may include:   ? Pain, such as chest or bone pain   ? Persistent, dry cough   ? Difficulty breathing   ? Loss of appetite   ? Persistent nausea, vomiting or weight loss   ? Swelling in the abdomen  ? Severe headaches  When to call our office You know your body best ? what feels normal and what doesn't. Check your breasts or chest wall after lumpectomy/mastectomy every month to look for changes.   It's important to be aware of the signs and symptoms of recurrent breast cancer, such as:   ? New and persistent pain   ? Changes or new lumps in your reconstructed breast  ? Weight loss   ? Shortness of breath    If you experience any signs and symptoms that might suggest a recurrence, call our office    Plan to return to clinic in one year for surveillance visit.     Durenda Age, PA-C  Gynecologic Oncology

## 2019-09-29 ENCOUNTER — Encounter: Admit: 2019-09-29 | Discharge: 2019-09-29 | Payer: BC Managed Care – HMO

## 2020-11-26 ENCOUNTER — Encounter: Admit: 2020-11-26 | Discharge: 2020-11-26 | Payer: BC Managed Care – HMO

## 2020-12-09 ENCOUNTER — Encounter: Admit: 2020-12-09 | Discharge: 2020-12-09 | Payer: BC Managed Care – HMO

## 2020-12-09 DIAGNOSIS — K219 Gastro-esophageal reflux disease without esophagitis: Secondary | ICD-10-CM

## 2020-12-09 DIAGNOSIS — M549 Dorsalgia, unspecified: Secondary | ICD-10-CM

## 2020-12-09 DIAGNOSIS — E785 Hyperlipidemia, unspecified: Secondary | ICD-10-CM

## 2020-12-09 DIAGNOSIS — F419 Anxiety disorder, unspecified: Secondary | ICD-10-CM

## 2020-12-09 DIAGNOSIS — R002 Palpitations: Secondary | ICD-10-CM

## 2020-12-10 ENCOUNTER — Encounter: Admit: 2020-12-10 | Discharge: 2020-12-10 | Payer: BC Managed Care – HMO

## 2020-12-10 DIAGNOSIS — U071 COVID-19: Secondary | ICD-10-CM

## 2020-12-10 DIAGNOSIS — R0789 Other chest pain: Secondary | ICD-10-CM

## 2020-12-10 DIAGNOSIS — C541 Malignant neoplasm of endometrium: Secondary | ICD-10-CM

## 2020-12-10 DIAGNOSIS — K219 Gastro-esophageal reflux disease without esophagitis: Secondary | ICD-10-CM

## 2020-12-10 DIAGNOSIS — F419 Anxiety disorder, unspecified: Secondary | ICD-10-CM

## 2020-12-10 DIAGNOSIS — E785 Hyperlipidemia, unspecified: Secondary | ICD-10-CM

## 2020-12-10 DIAGNOSIS — R002 Palpitations: Secondary | ICD-10-CM

## 2020-12-10 DIAGNOSIS — E7800 Pure hypercholesterolemia, unspecified: Secondary | ICD-10-CM

## 2020-12-10 DIAGNOSIS — M549 Dorsalgia, unspecified: Secondary | ICD-10-CM

## 2020-12-10 DIAGNOSIS — Z23 Encounter for immunization: Secondary | ICD-10-CM

## 2020-12-10 NOTE — Progress Notes
Date of Service: 12/10/2020    Lisa Shelton Shelton is a 60 y.o. female.       HPI     Patient is a 60 year old white female that has a history of chest pain, heart palpitations, family history of premature coronary artery disease and history of endometrial cancer, status post total hysterectomy at age 79, status post menopausal state at age 70, patient is in complete remission.    On 11/01/2020 patient presented to the emergency room department at [facility] due to experiencing episodes of chest discomfort, this occurred while she was at work, she described them as sharp pain radiating across her precordium, it was made worse by deep inspiration, it lasted approximately 1 hour, they were associated with symptomatic heart palpitations.  Patient thinks that her heart rate went up to 120 bpm.    She reported to the local emergency room department and she was given nitroglycerin which did not improve the pain.  When she was hooked up to oxygen flow her pain improved.    In the emergency room department patient was evaluated with a chest CT, I do not demonstrate any evidence of pulmonary embolism, the chest x-ray was also unremarkable.  In addition patient was evaluated with 12-lead echocardiogram, and one of them demonstrated a heart rate of 101 bpm with subtle ST segment depression that was mostly going upward.    Subsequently patient was evaluated with a stress echocardiogram, the report is available for my review, this demonstrated normal left ventricular systolic function, hyperdynamic response was present with Bruce treadmill protocol, there were no electrocardiographic changes compatible with ischemia and also no wall motion abnormalities, and therefore this study was negative for ischemia.  Of note, patient did undergo a Bruce protocol she exercised slightly more than 8 minutes with an appropriate hemodynamic response to exercise.    Patient has not had any recurrent symptoms of chest pain ever since.    Past medical history: See above    Social history: Patient does not smoke cigarettes, she drinks alcohol occasionally, she has a desk job.    Family history: Her father died due to an MI, patient's brother did underwent CABG, mother did undergo stents placement.         Vitals:    12/10/20 0843   BP: 118/70   BP Source: Arm, Left Upper   Pulse: 75   SpO2: 97%   O2 Device: None (Room air)   PainSc: Zero   Weight: 60.1 kg (132 lb 6.4 oz)   Height: 157.5 cm (5' 2)     Body mass index is 24.22 kg/m?Marland Kitchen     Past Medical History  Patient Active Problem List    Diagnosis Date Noted   ? Anxiety 12/09/2020   ? Palpitations 12/09/2020   ? Hyperlipemia 12/09/2020   ? Chest pain 12/09/2020     11/05/20 Treadmill stress echo: Normal LV size and function normal wall thickness EF 74%.  No ECG evidence of ischemia.  No Echocardiographic evidence of ischemia.       ? 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    Lisa Shelton Lisa Shelton Bras, MD  PCP: Lisa Shelton Sorrow, MD    HPI:    Lisa Shelton Shelton is a  60 yo G2 P2  who had a well woman exam and Pap smear on December 11th 2017.  At that time she reported postmenopausal bleeding and cramping since  she began hormone replacement therapy in February 2017.  Plan was to follow-up in February.    On September 16, 2016 she had an ultrasound that showed the uterus to be anteverted measuring 9.2 x 3.8 x 5 cm with a 9 mm endometrial thickening as well as small heterogeneously echogenic structure along the ventral endometrium measuring 5 x 6  millimeters.  There is focal blood flow along this lesion.    On September 29, 2016 showed a follow-up with her primary care physician for an endometrial biopsy which showed an endometrioid adenocarcinoma favor endometrioid FIGO grade 2.  There were however focal areas with higher grade cytology nuclear grade 3 which raises the possibility of a high-grade tumor.  Immunostains attempted for clarification but demonstrated scattered and variable positivity for P 16, p53 and Ki-67.  While endometrioid adenocarcinoma is favored other high-grade subtype such as high-grade serous cannot be completely excluded by biopsy alone.    She is now being referred to my office.  Her last office note with Dr. Fredia Shelton on 09/29/2016.  The patient is wondering about postmenopausal bleeding and getting back on her polyps.  She did an endometrial biopsy polyp was visualized, cervical versus endometrial.  This was noted on ultrasound.  Recommend a referral to Dr. Broadus Shelton for hysteroscopy [facility] and ablation pending results.  The uterus sounded to 9 cm.    She was seen by Dr. Larina Shelton on 07/27/2016 for well woman with Pap.  At that time she was noted to have some vaginal bleeding since starting hormone replacement therapy, she is following up in February with her gynecologist/HRT physician. To call if persistent bleeding for pelvic US. Also appears to have added progesterone  200 mg/day      TREATMENT:    1.  11/03/2016:  Robotic assisted total laparoscopic hysterectomy, bilateral salpingo-oophorectomy, bilateral pelvic and periaortic lymph node dissection.    Final Pathology:  Endometrial adenocarcinoma endometrioid type, FIGO grade?2,  invading 5mm out of 25mm myometrium.  Maximal tumor size: 2.9 cm. All P and PALND negative. Final pathology did not confirm p53 positivity.    2.  11/12/2016: Presented at Cancer Conference. No recommendations for treatment. Observation only.    3. 11/19/2016: Presents for first postoperative visit. The patient is aware of the pathology and recommendations.    4.  06/28/2017:  Last seen on 04/05/2017 for EOT visit.  She is now 7.5 month from surgery.  Understands screening for endometrial cancer including: q 6 month visits, pelvic evaluation, no pap smears and no CT Scans unless symptomatic.    5.  01/06/2018: presents to the office for a 6 month follow up. She is now 13.5 months from surgery.              OLDER RECORDS:    Pathology:  1.  MAWD: Specimen number S 18?12 40.  Endometrial biopsy: Endometrial adenocarcinoma favor endometrioid FIGO grade 2.  There are focal areas of high-grade cytology/nuclear grade 3 which raises the possibility of a high-grade tumor.  Immunostains were done but showed scattered and variable positivity for P 16, p53, and Ki-67.  Other high-grade subtype such as high-grade serous cannot be excluded.  2.  Pap smear collected 07/27/2016 [facility], atypical endometrial glandular cells of undetermined significance are present.    Radiology/procedures:    1. CT:  no  2. Ultrasound: [facility] [place] 09/16/2016 performed for dysfunctional uterine bleeding in a 60 year old female.  The uterus is anteverted measuring 9.2 x 3.8 x 5 cm.  The  endometrium measures up to 9 mm, there is small heterogeneously echoic structure along the ventral endometrium measuring 5 x 6 mm.  There is focal blood flow near the interface of the endometrium and the myometrium along this lesion.  No free fluid.  Both ovaries appeared normal.  3. Other: no        PMH:    1. Any history of problems with heart/lung/kidney/liver/hepatitis/DM/thyroid disease/hematologic disorders/DVT: Yes  2.  BMI, 132 pounds  3.  Smoker 3-4 cigarettes per week  4.  Chronic headaches  5.  GERD  6.  DU P?teens, depression        PSH:    1.  Tubal 1986  2.  Cholecystectomy in 2014          OB/GYN:  1. G2 P2  2. Menarche: 14  3. LMP/menopausal: Late 2013  4. OCP/ERT/HRT: Yes for under 1 year; hormone replacement therapy started February 2017 progesterone 100 mg recently quit, used pellets?  For 1 month then DC'd  5. Birth control-current: Not applicable  6. STDs: no  7. Sexually active: yes  8. Fibroids/endometriosis: unknown  9. If premenopausal, menstrual pattern, normal: Not applicable  10. Last pap smear: 10/27/2012 normal      SH:  1. Smoke: 3-4 cig/week. 1 pack year hx.  2. Drugs: no  3. ETOH: social  4. 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  5. Marital status: divorced; lives with boyfriend  6. Last colonoscopy: 2014  7. Last Mammogram: 06/2016      FH:  1. Cancer: m-breast; sister-lymphoma. No hx of colon, pancreatic, ovarian, uterine                           Review of Systems   Constitutional: Negative.   HENT: Negative.    Eyes: Negative.    Cardiovascular: Positive for chest pain.   Respiratory: Negative.    Endocrine: Negative.    Hematologic/Lymphatic: Negative.    Skin: Negative.    Musculoskeletal: Negative.    Gastrointestinal: Negative.    Genitourinary: Negative.    Neurological: Negative.    Psychiatric/Behavioral: Negative.    Allergic/Immunologic: Negative.        Physical Exam  General Appearance: normal in appearance  Skin: warm, moist, no ulcers or xanthomas  Eyes: conjunctivae and lids normal, pupils are equal and round  Lips & Oral Mucosa: no pallor or cyanosis  Neck Veins: neck veins are flat, neck veins are not distended  Chest Inspection: chest is normal in appearance  Respiratory Effort: breathing comfortably, no respiratory distress  Auscultation/Percussion: lungs clear to auscultation, no rales or rhonchi, no wheezing  Cardiac Rhythm: regular rhythm and normal rate  Cardiac Auscultation: S1, S2 normal, no rub, no gallop  Murmurs: no murmur  Carotid Arteries: normal carotid upstroke bilaterally, no bruit  Lower Extremity Edema: no lower extremity edema  Abdominal Exam: soft, non-tender, no masses, bowel sounds normal  Liver & Spleen: no organomegaly  Language and Memory: patient responsive and seems to comprehend information  Neurologic Exam: neurological assessment grossly intact    Cardiovascular Studies  Twelve-lead EKG demonstrates normal sinus rhythm, nonspecific/borderline T wave abnormalities in anterolateral leads, no axis deviation, ventricular rate is 67 bpm.    Cardiovascular Health Factors  Vitals BP Readings from Last 3 Encounters:   12/10/20 118/70   09/07/19 120/71   01/05/19 114/77     Wt Readings from Last 3 Encounters:   12/10/20 60.1 kg (132  lb 6.4 oz)   09/07/19 64 kg (141 lb)   01/05/19 61.2 kg (135 lb)     BMI Readings from Last 3 Encounters:   12/10/20 24.22 kg/m?   09/07/19 24.98 kg/m?   01/05/19 23.91 kg/m?      Smoking Social History     Tobacco Use   Smoking Status Former Smoker   ? Packs/day: 0.25   ? Years: 12.00   ? Pack years: 3.00   ? Types: Cigarettes   ? Quit date: 08/25/2016   ? Years since quitting: 4.2   Smokeless Tobacco Never Used      Lipid Profile No results found for: CHOL  No results found for: HDL  No results found for: LDL  No results found for: TRIG   Blood Sugar No results found for: HGBA1C  Glucose   Date Value Ref Range Status   10/26/2016 100 70 - 100 MG/DL Final          Problems Addressed Today  Encounter Diagnoses   Name Primary?   ? Other chest pain Yes   ? Palpitations    ? COVID-19 vaccine administered    ? COVID-19    ? Endometrial cancer (HCC)    ? Pure hypercholesterolemia        Assessment and Plan     In summary: This is a 60 year old white female that presents with the following cardiovascular/clinical issues:    1.  Chest pain episode?this occurred on 11/01/2020, at that time patient was evaluated and admitted at [facility] well hospital in [place], [place], she did not suffer an acute coronary syndrome  2.  Pericarditis? patient symptoms appear to be compatible with perhaps idiopathic pericarditis  3.  Unremarkable stress echocardiographic evaluation  4.  History of symptomatic heart palpitations  5.  Family history of premature coronary artery disease  6.  Status post COVID-19 vaccination  7.  Status post COVID-19 upper respiratory tract infection  8.  History of endometrial cancer?currently in complete remission, patient did not undergo a total hysterectomy    Plan:    1.  I asked the patient to continue her current medication, however I lifted up to her discretion if she was to continue aspirin 81 mg p.o. daily  2.  We discussed about overall risk factors modifications, living a healthy lifestyle, continuing to take vitamin D3 5000 units/day, vitamin C 1000 mg a day and elemental zinc 50 mg p.o. daily.  3.  I recommended regular physical activity  4.  Continue to follow-up with your office, no further cardiac work-up at this point in time.  Patient can follow-up with our office on an as-needed basis.      Total Time Today was 50 minutes in the following activities: Preparing to see the patient, Obtaining and/or reviewing separately obtained history, Performing a medically appropriate examination and/or evaluation, Counseling and educating the patient/family/caregiver, Ordering medications, tests, or procedures, Referring and communication with other health care professionals (when not separately reported), Documenting clinical information in the electronic or other health record, Independently interpreting results (not separately reported) and communicating results to the patient/family/caregiver and Care coordination (not separately reported)           Current Medications (including today's revisions)  ? acetaminophen (TYLENOL) 500 mg tablet Take 1,000 mg by mouth three times daily. Max of 4,000 mg of acetaminophen in 24 hours.    ? aspirin EC 81 mg tablet Take 81 mg by mouth daily. Take with food.   ? atorvastatin (  LIPITOR) 10 mg tablet Take 10 mg by mouth daily.   ? cholecalciferol (VITAMIN D-3) 1,000 units tablet Take 2,000 Units by mouth daily.   ? L.acid/L.casei/B.bif/B.lon/FOS (PROBIOTIC BLEND PO) Take  by mouth daily.   ? MV-Min-Vit C-Glut-Lysine-Hb124 (AIRBORNE (LYSINE HCL)) 1,000-50 mg tbef Take 1 tablet by mouth twice daily.   ? nitrofurantoin monohyd/m-cryst (MACROBID) 100 mg capsule Take 100 mg by mouth daily. Take with food.    ? omeprazole DR (PRILOSEC) 40 mg capsule Take 40 mg by mouth daily before breakfast.

## 2020-12-26 ENCOUNTER — Encounter: Admit: 2020-12-26 | Discharge: 2020-12-26 | Payer: BC Managed Care – HMO

## 2020-12-26 DIAGNOSIS — C541 Malignant neoplasm of endometrium: Secondary | ICD-10-CM

## 2020-12-26 DIAGNOSIS — F419 Anxiety disorder, unspecified: Secondary | ICD-10-CM

## 2020-12-26 DIAGNOSIS — K219 Gastro-esophageal reflux disease without esophagitis: Secondary | ICD-10-CM

## 2020-12-26 DIAGNOSIS — E785 Hyperlipidemia, unspecified: Secondary | ICD-10-CM

## 2020-12-26 DIAGNOSIS — M549 Dorsalgia, unspecified: Secondary | ICD-10-CM

## 2020-12-26 DIAGNOSIS — R002 Palpitations: Secondary | ICD-10-CM

## 2020-12-27 ENCOUNTER — Encounter: Admit: 2020-12-27 | Discharge: 2020-12-27 | Payer: BC Managed Care – HMO

## 2020-12-27 DIAGNOSIS — R002 Palpitations: Secondary | ICD-10-CM

## 2020-12-27 DIAGNOSIS — K219 Gastro-esophageal reflux disease without esophagitis: Secondary | ICD-10-CM

## 2020-12-27 DIAGNOSIS — F419 Anxiety disorder, unspecified: Secondary | ICD-10-CM

## 2020-12-27 DIAGNOSIS — E785 Hyperlipidemia, unspecified: Secondary | ICD-10-CM

## 2020-12-27 DIAGNOSIS — M549 Dorsalgia, unspecified: Secondary | ICD-10-CM

## 2021-07-09 ENCOUNTER — Encounter: Admit: 2021-07-09 | Discharge: 2021-07-09 | Payer: BC Managed Care – HMO

## 2021-07-09 NOTE — Telephone Encounter
Called patient left message to reschedule appts with Allysa Due to clinic closure.

## 2021-12-25 ENCOUNTER — Encounter: Admit: 2021-12-25 | Discharge: 2021-12-25 | Payer: BC Managed Care – HMO

## 2021-12-25 NOTE — Telephone Encounter
This RN called patient to reschedule her appointment from today as provider is out for a family emergency. Patient prefers [facility] location. Rescheduled to 01/22/22 at 11:00 am per her preference. Patient verbalizes understanding and has no questions.

## 2022-01-19 NOTE — Progress Notes
Subjective      GYNECOLOGIC ONCOLOGY EVALUATION    Date: 12/26/2020    Name:Lisa Shelton     Referring Physician:     Primary Care Physician: [name] A    Chief Complaint:   Chief Complaint   Patient presents with   ? Heme/Onc Care       History of Present Illness: Lisa Shelton is a 61 y.o. 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      Onc Timeline Overview Note   Lisa Shelton is a 61 y.o. 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  Here for 5 year 2 month cancer surveillance visit.     Today patient reports feeling very well other than some sleep issues. She has been working with PCP on sleep strategies. She has tried melatonin and yoga. She doesn't seem to sleep but a few hours at a time. She feels her mind often won't shut off. She is dealing with some things with her mother now in a care facility and she thinks this is part of what is contributing.   She reports having some mild constipation feeling this week but attributes this to a poor diet as summer started. She typically has pretty normal BMs.     Otherwise Patient reports her appetite is good, she is hydrating well and her energy level is ok other than feeling tired due to lack of sleep. She denies chest pain, SOA, cough, abdominal pain/bloating, nausea/vomiting, major changes in bowel function, hematuria, hematochezia, vaginal bleeding/discharge, urinary frequency/urgency/dysuria, bone pain, LE edema, fevers/chills and peripheral neuropathy.      Mammo was 07/2021-normal per patient  Colonoscopy due next year 2024     Endometrial cancer (HCC)   10/11/2016 Initial Diagnosis    [facility] 07/27/16 with report of PMB.  TVUS with thickened endometrium 0.9 cm along with echoic structure along the ventral endometrium measuring 0.5 x 0.6 cm.  EMB with endometrioid carcinoma, FIGO grade 2, nuclear grade 3.      10/16/2016 Imaging    CT C/A/P without lymphadenopathy. Identified enlarged uterus with prominent endometrial stripe.  Lungs with noted granulomas or scarring only.     11/03/2016 Surgery    RATLH/BSO with bilateral P and PALND per Dr. Kem Boroughs.  Serosal defect to the duodenum with repair by Dr. Kathee Delton.     11/03/2016 Pathology    Uterus with endometrioid adenocarcinoma, FIGO grade 2.  DOI 5/25 mm.  No cervical involvement.  0/41 LN involved.  Tumor size 2.9 cm.  Pelvic washing (-) for malignant cells.    MLH1 (99%); MSH 2 (98%); MSH6 (99%); PMS2 (99%); ER (40%); PR (50%); Ki67 63%; p53 40%.  NO EVIDENCE OF MICROSATELLITE INSTABILITY.               Past Medical History:  Medical History:   Diagnosis Date   ? Anxiety 12/09/2020   ? Back pain    ? GERD (gastroesophageal reflux disease)    ? Hyperlipemia 12/09/2020   ? Palpitations 12/09/2020       Past Surgical History:  Surgical History:   Procedure Laterality Date   ? HX TUBAL LIGATION  1986   ? HX CHOLECYSTECTOMY  2014    laprascopic   ? ROBOTIC-ASSISTED HYSTERECTOMY, BILATERAL SALPINGO-OOPHORECTOMY, BILATERAL PELVIC AND PERI-AORTIC LYMPH NODE DISSECTION  Bilateral 11/03/2016    Performed by Irine Seal, MD at [facility] OR   ? REPAIR OF  ENTEROTOMY  11/03/2016    Performed by Costella Hatcher, DO at [facility] OR   ? COLONOSCOPY         Medications:    Current Outpatient Medications:   ?  aspirin EC 81 mg tablet, Take 81 mg by mouth daily. Take with food., Disp: , Rfl:   ?  atorvastatin (LIPITOR) 10 mg tablet, Take one tablet by mouth daily., Disp: , Rfl:   ?  cholecalciferol (VITAMIN D-3) 1,000 units tablet, Take two tablets by mouth daily., Disp: , Rfl:   ?  L.acid/L.casei/B.bif/B.lon/FOS (PROBIOTIC BLEND PO), Take  by mouth daily., Disp: , Rfl:   ?  MV-Min-Vit C-Glut-Lysine-Hb124 1,000-50 mg tbef, Take 1 tablet by mouth twice daily., Disp: , Rfl:   ?  nitrofurantoin monohyd/m-cryst (MACROBID) 100 mg capsule, Take one capsule by mouth daily. Take with food., Disp: , Rfl:   ?  omeprazole DR (PRILOSEC) 40 mg capsule, Take one capsule by mouth daily before breakfast., Disp: , Rfl: 2    Allergies:  No Known Allergies    Social History:  Social History     Socioeconomic History   ? Marital status: Single   Tobacco Use   ? Smoking status: Former     Packs/day: 0.25     Years: 12.00     Pack years: 3.00     Types: Cigarettes     Quit date: 08/25/2016     Years since quitting: 5.4   ? Smokeless tobacco: Never   Substance and Sexual Activity   ? Alcohol use: Yes     Alcohol/week: 1.0 standard drink of alcohol     Types: 1 Glasses of wine per week     Comment: rarely   ? Drug use: No       Family History:  Family History   Problem Relation Age of Onset   ? Cancer-Breast Mother    ? Diabetes Mother    ? Heart Disease Mother    ? Hypertension Mother    ? High Cholesterol Mother    ? Thyroid Disease Mother    ? Diabetes Brother    ? Heart Disease Brother    ? Hypertension Brother    ? Thyroid Disease Brother          REVIEW OF SYSTEMS:     CONSTITUTIONAL: Negative unless stated in HPI  EYES: Negative unless stated in HPI  ENT: Negative unless stated in HPI  RESPIRATORY: Negative unless stated in HPI  CARDIOVASCULAR: Negative unless stated in HPI  GI: Negative unless stated in HPI  GU: Negative unless stated in HPI  MUSCULO-SKELETAL: Negative unless stated in HPI  SKIN: Negative unless stated in HPI    ECOG 0      Physical Exam  BP 108/67 (BP Source: Arm, Right Upper, Patient Position: Sitting)  - Pulse 62  - Temp 36.8 ?C (98.2 ?F) (Temporal)  - Resp 16  - Wt 60.9 kg (134 lb 3.2 oz)  - SpO2 100%  - BMI 24.55 kg/m?   GENERAL APPEARANCE: Appears healthy.  Alert; in no acute distress.  Pleasant.  HEENT: Unremarkable. No tenderness or masses noted.  NECK: Neck supple. No tenderness. No adenopathy.    LUNGS: Chest symmetrical. Good diaphragmatic excursion. Lungs clear; normal breath sounds.  CARDIOVASCULAR:  RRR. Heart sounds normal.      ABDOMEN: Abdomen soft, non-tender.  No masses, organomegaly, or hernia. No clinical evidence of ascites.    PELVIC:   External genitalia, anus, perineum, urethral meatus,  urethra, bladder and vagina normal. Cervix, uterus, and adnexae are surgically absent. Speculum exam with well approximated vaginal cuff. No erythema, bleeding, abnormal discharge, or lesions noted. Bimanual exam without masses or nodularity on the pelvic floor. Exam chaperoned by nurse    EXTREMITIES: Extremities normal. No joint deformities, edema, or skin discoloration. Station and gait normal.  SKIN: Skin color, texture, turgor normal. No rashes or lesions.  LYMPH NODES: No palpable supraclavicular or inguinal lymph nodes.        ASSESSMENT/PLAN:Lisa Shelton is a 61 y.o. 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  Here for 5 year 2 month cancer surveillance visit.    ? No evidence of disease on exam or ROS today.  ? Sleep disturbance- Discussed addition of nightly meditation app, may try  weighted blanket/or weighted eye mask, continue with yoga and stress relief activities. Avoid screen time/tv/tablet/computer and caffeine for several hours prior to bed. May consider daily magnesium supplement. Other recs: Regular exercise and healthy diet. Sleep and wake same scheduled time daily.   ? Discussed possible symptoms of cancer recurrence, such as cough, chest pain, early satiety, abdominal pain/bloating, N/V, vaginal bleeding/discharge or cange in bowel/bladder habits, dizziness, HA.    ? Recommend daily exercise, diet high in fruits and vegetables, annual visits with PCP including updated vaccination and routine screening (mammogram, DEXA and colonoscopy).    ? Recommend screening for breast cancer with MMG, colon cancer with colonoscopy, osteoporosis with bone density. Recommend staying up to date on immunizations.  ? Regular follow up with PCP discussed.  ? Now >5 years post treatment, NED. Release from gyn onc at this time. Follow yearly with PCP or gyn for pelvic exams  ? RTC prn    Deon Pilling, APRN-NP    Total Time Today was 30 minutes in the following activities: Preparing to see the patient, Obtaining and/or reviewing separately obtained history, Performing a medically appropriate examination and/or evaluation, Counseling and educating the patient/family/caregiver and Documenting clinical information in the electronic or other health record

## 2022-01-22 ENCOUNTER — Encounter: Admit: 2022-01-22 | Discharge: 2022-01-22 | Payer: BC Managed Care – HMO

## 2022-01-22 DIAGNOSIS — M549 Dorsalgia, unspecified: Secondary | ICD-10-CM

## 2022-01-22 DIAGNOSIS — G479 Sleep disorder, unspecified: Secondary | ICD-10-CM

## 2022-01-22 DIAGNOSIS — C541 Malignant neoplasm of endometrium: Secondary | ICD-10-CM

## 2022-01-22 DIAGNOSIS — R002 Palpitations: Secondary | ICD-10-CM

## 2022-01-22 DIAGNOSIS — K219 Gastro-esophageal reflux disease without esophagitis: Secondary | ICD-10-CM

## 2022-01-22 DIAGNOSIS — F419 Anxiety disorder, unspecified: Secondary | ICD-10-CM

## 2022-01-22 DIAGNOSIS — E785 Hyperlipidemia, unspecified: Secondary | ICD-10-CM

## 2023-03-19 ENCOUNTER — Encounter: Admit: 2023-03-19 | Discharge: 2023-03-19 | Payer: BC Managed Care – HMO

## 2023-09-04 IMAGING — MG MAMMOGRAM 3D SCREEN, BILATERAL
8 series · 8 of 8 positions shown · non-contrast
Comparison: none

[R CC]
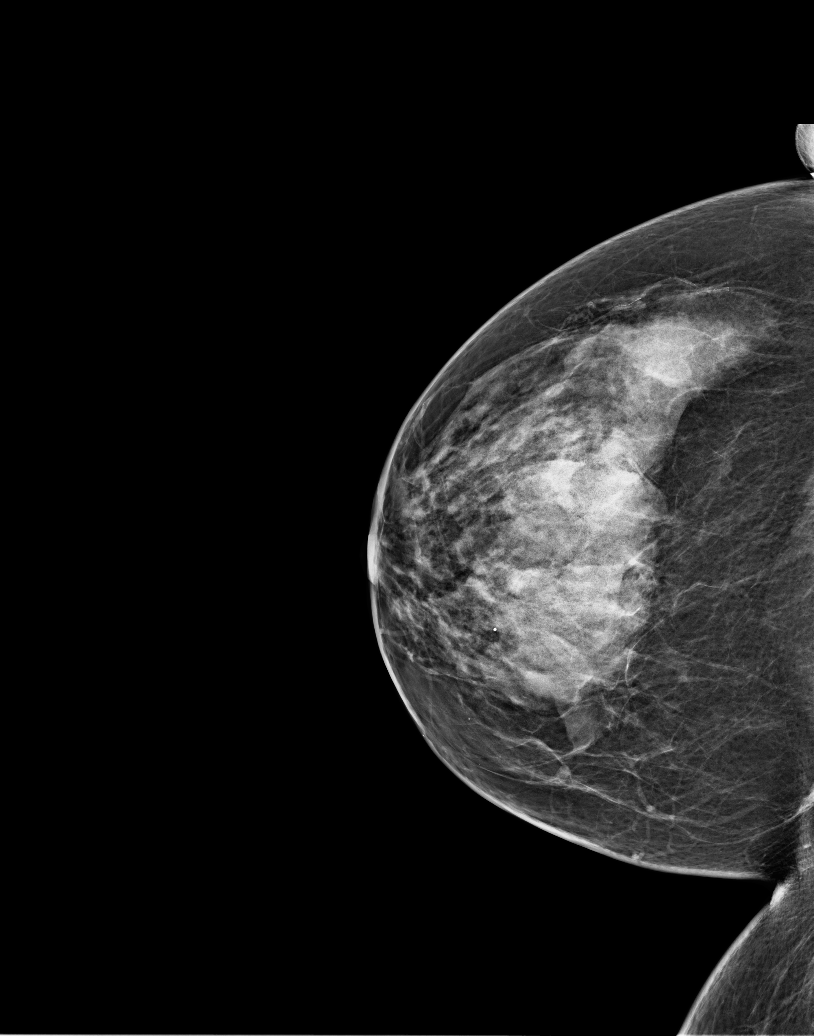

[R tomo (1 of 2)]
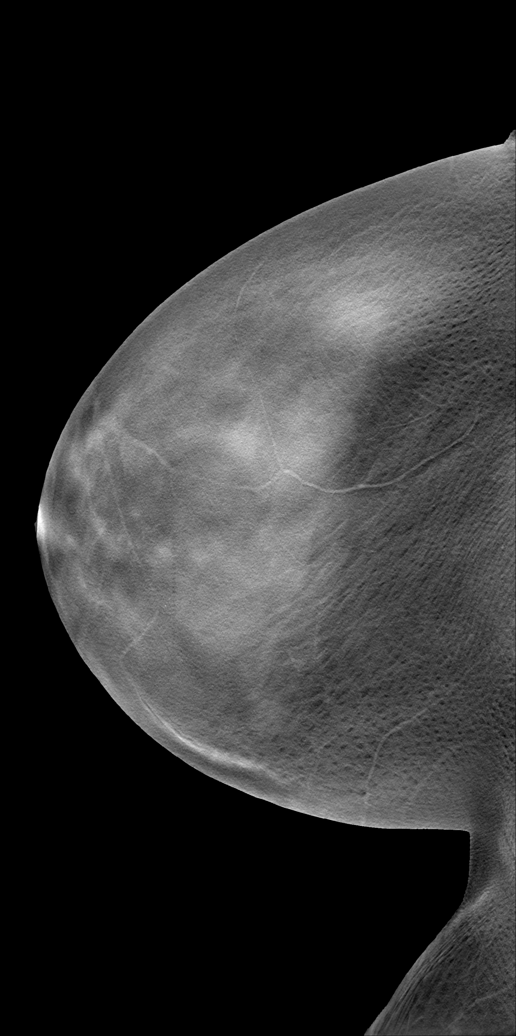

[L CC]
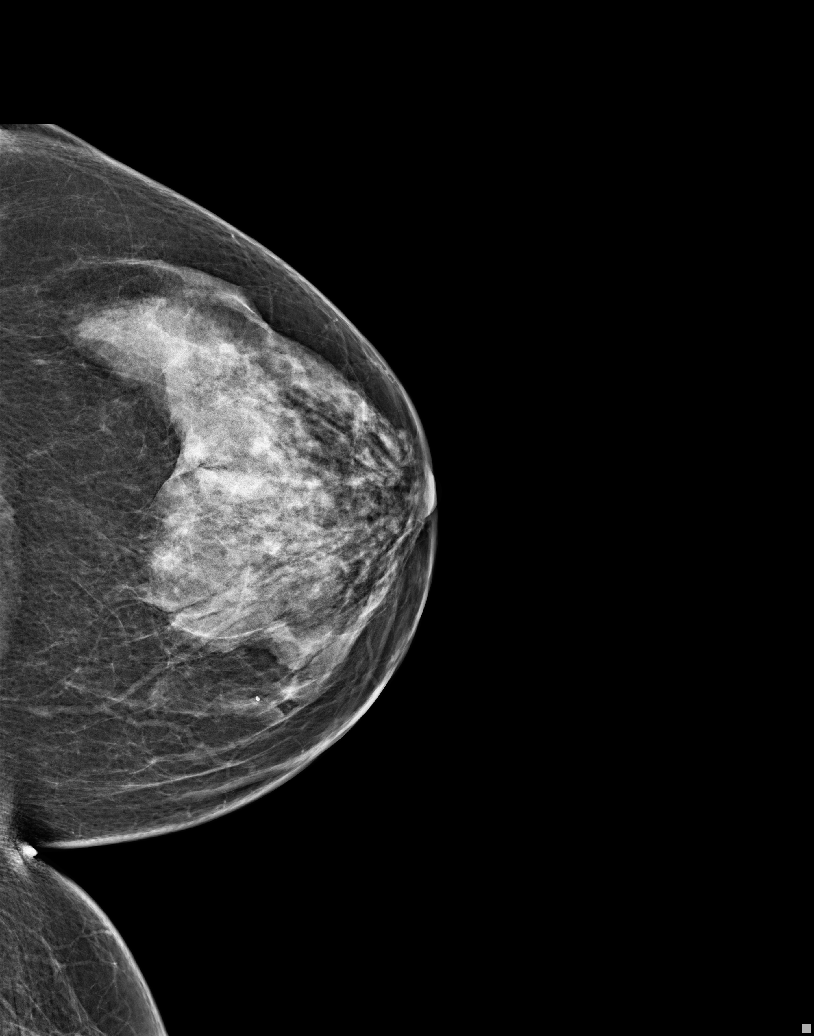

[L tomo (1 of 2)]
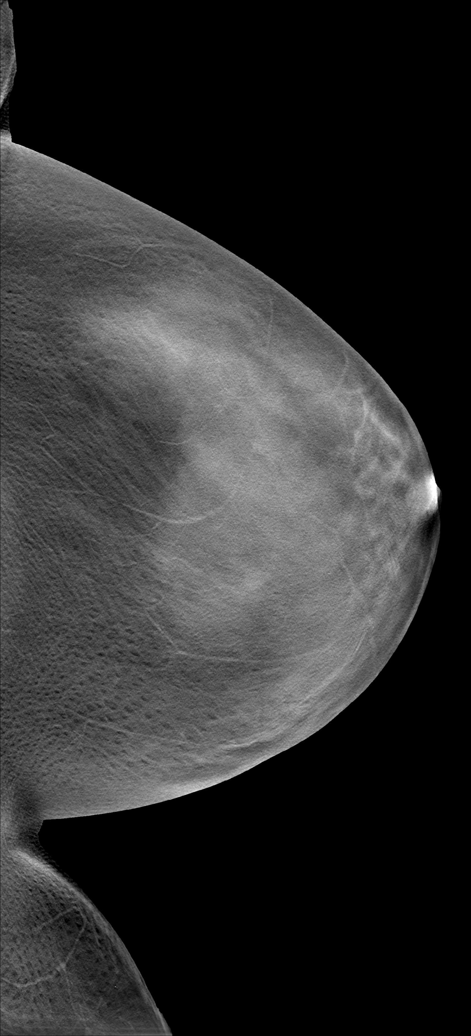

[R MLO]
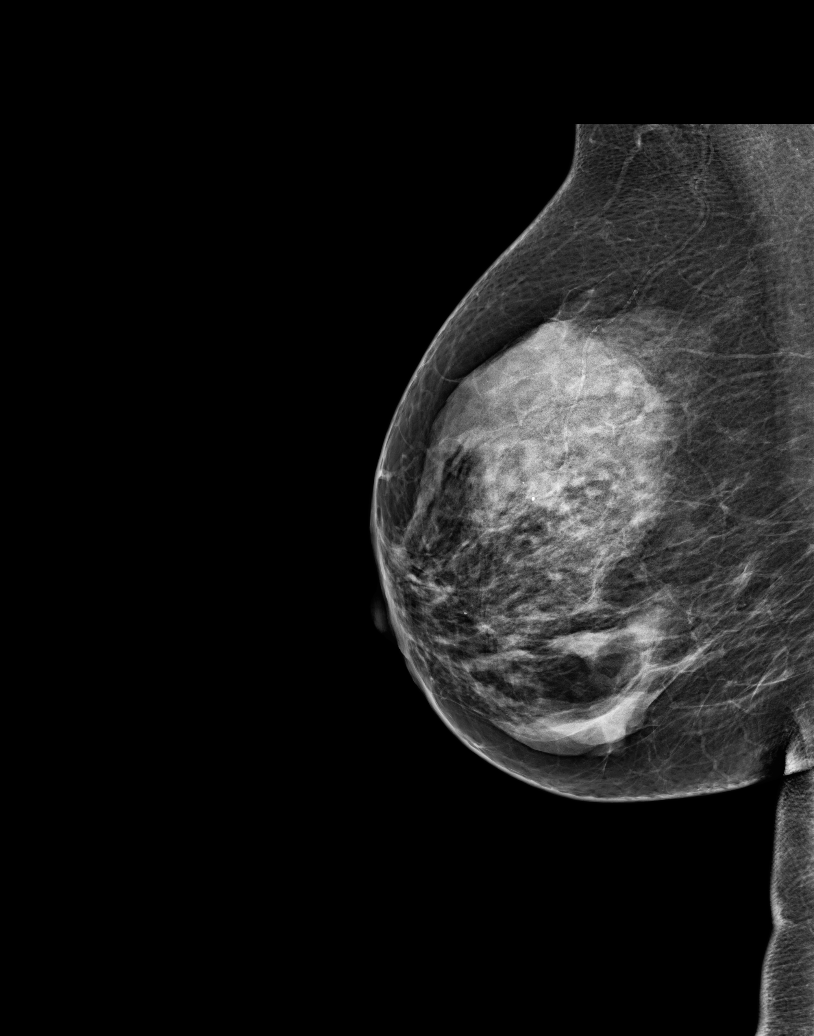

[R tomo (2 of 2)]
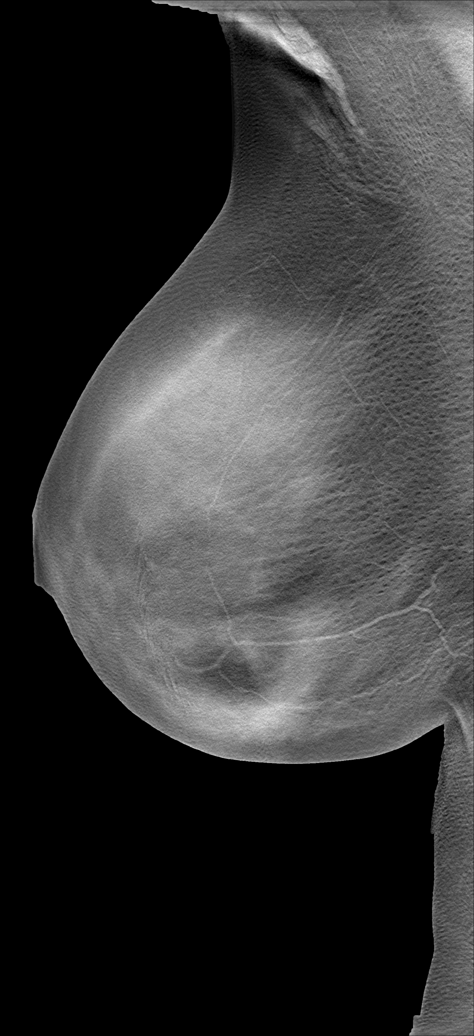

[L MLO]
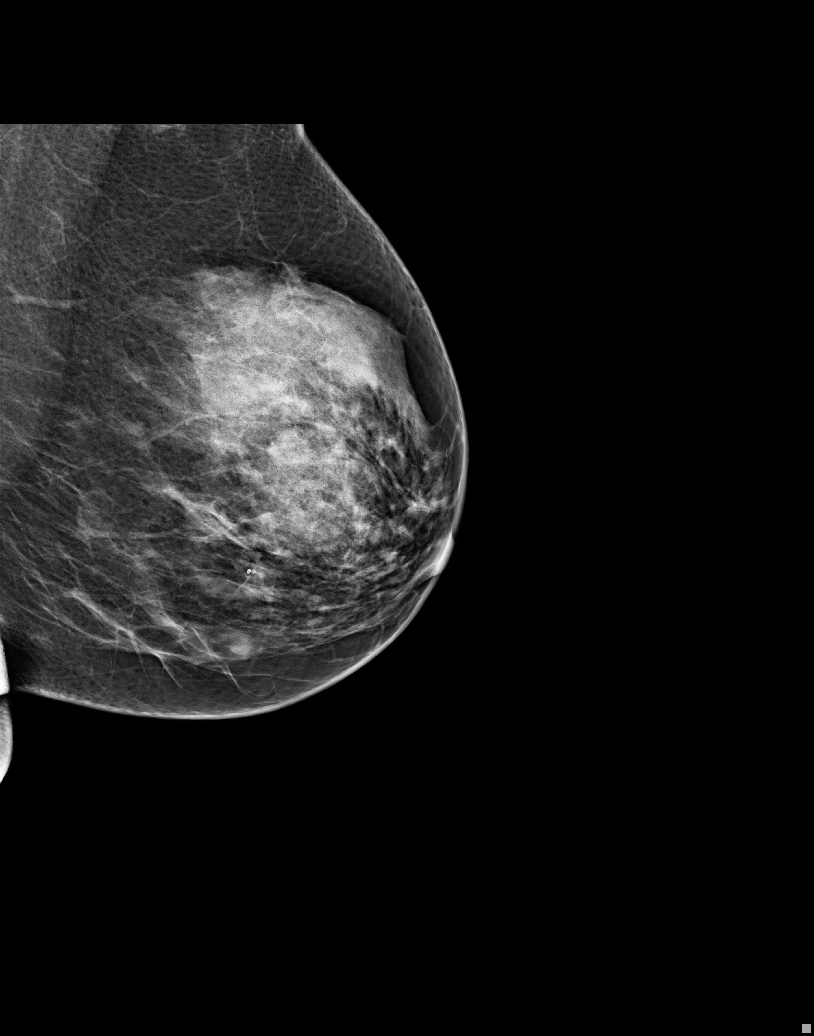

[L tomo (2 of 2)]
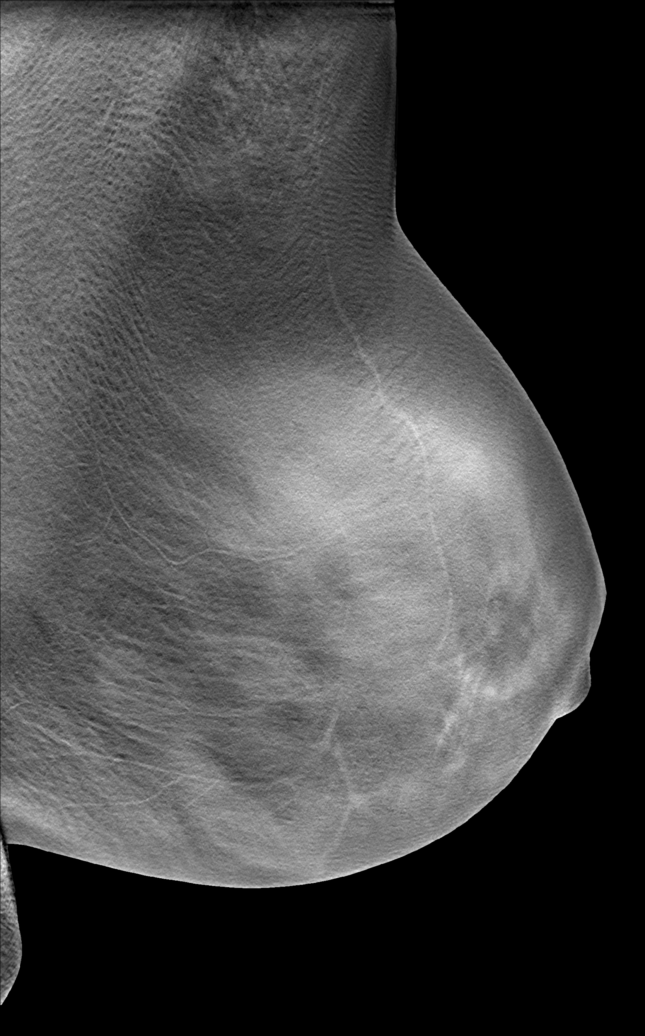

[8 of 8 positions shown; findings below may reference images not displayed]

EXAM

3D SCREENING MAMMOGRAM, BILATERAL

INDICATION

screening mammogram
UTERINE CA 0229.  MOTHER BREAST CA @ 62.  TC SCORE: 10 YR RISK (July 2021) =  5.0%, LIFETIME RISK=
10.6%.  SCREENING.  AB (3D) PRIORS: 5359.

TECHNIQUE

Digital 2D CC and MLO projections obtained with 3D tomographic views per manufacturer's protocol.
ICAD version 7.2 was used during this exam.

COMPARISONS

July 23, 2020

FINDINGS

ACR Type 2:  25-50% There are scattered fibroglandular densities.

No concerning masses, calcifications, or interval changes are seen.

IMPRESSION

Stable bilateral mammography. One year follow-up is recommended.

BI-RADS 2, BENIGN.

Tech Notes:

## 2023-11-23 ENCOUNTER — Encounter: Admit: 2023-11-23 | Discharge: 2023-11-23 | Payer: BC Managed Care – HMO

## 2024-05-09 ENCOUNTER — Encounter: Admit: 2024-05-09 | Discharge: 2024-05-09 | Payer: BLUE CROSS/BLUE SHIELD
